# Patient Record
Sex: Female | Born: 1940 | Race: White
Health system: Southern US, Community
[De-identification: ages and names within clinical notes are randomized; demographics above are authoritative.]

---

## 2019-02-27 ENCOUNTER — Other Ambulatory Visit: Payer: Self-pay

## 2019-02-27 ENCOUNTER — Encounter: Payer: Self-pay | Admitting: Orthopaedic Surgery

## 2019-02-27 ENCOUNTER — Ambulatory Visit (INDEPENDENT_AMBULATORY_CARE_PROVIDER_SITE_OTHER): Payer: Medicare HMO | Admitting: Orthopaedic Surgery

## 2019-02-27 DIAGNOSIS — M25531 Pain in right wrist: Secondary | ICD-10-CM

## 2019-02-27 NOTE — Progress Notes (Signed)
Office Visit Note   Patient: Joan Hawkins           Date of Birth: 09/21/1940           MRN: [id number] Visit Date: 02/27/2019              Requested by: No referring provider defined for this encounter. PCP: Patient, No Pcp Per   Assessment & Plan: Visit Diagnoses:  1. Pain in right wrist     Plan: At this point I do feel an MRI arthrogram of the right wrist is warranted to rule out a TFCC tear or any other pathology on the ulnar side of her wrist that is causing the severity of her pain.  This is also warranted given the failure of her conservative treatment through multiple modalities.  All question concerns were answered and addressed.  She will call for follow-up ointment which she knows when she is having the MRI arthrogram of her right wrist.  Follow-Up Instructions:  Follow-up after right wrist MRI  Orders:  No orders of the defined types were placed in this encounter.  No orders of the defined types were placed in this encounter.     Procedures: No procedures performed   Clinical Data: No additional findings.   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  She denies any specific injuries and though she has some arthritic changes but this is the pain is been getting worse and it does shoot up into her forearm.  She has had at least 3 steroid injections in the area that have only helped temporarily.  She apparently had nerve conduction studies that only showed some mild carpal tunnel disease but no cubital tunnel disease.  She has had an ultrasound that did not show any type of mass on her wrist.  It has at this point detrimentally affecting her mobility, her quality of life and her actives of daily living.  She is tried anti-inflammatories as well as therapy and activity  modification.  HPI  Review of Systems She currently denies any headache, chest pain, shortness of breath, fever, chills, nausea, vomiting  Objective: Vital Signs: There were no vitals taken for this visit.  Physical Exam She is alert and orient x3 and in no acute distress Ortho Exam Examination of her right wrist does show significant pain to palpation over the ulnar styloid and ulnar side of her wrist.  Ulnar deviation causes pain.  The extremes of pronation supination causes severe ulnar-sided wrist pain.  She has negative Tinel sign at the carpal tunnel and a negative pillow sign at the cubital tunnel both on the right side.  She does have weak pinch and grip strength but this is equal bilaterally.  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 Specialty Comments:  No specialty comments available.  Imaging: No results found.   PMFS History: There are no active problems to display for this patient.  History reviewed. No pertinent past medical history.  History reviewed. No pertinent family history.  History reviewed. No pertinent surgical history. Social History   Occupational History   Not on file  Tobacco Use   Smoking status: Not on file  Substance and Sexual Activity   Alcohol use: Not on file   Drug use:  Not on file   Sexual activity: Not on file

## 2019-03-08 ENCOUNTER — Other Ambulatory Visit: Payer: Self-pay | Admitting: Orthopaedic Surgery

## 2019-03-08 DIAGNOSIS — M25531 Pain in right wrist: Secondary | ICD-10-CM

## 2019-03-22 ENCOUNTER — Ambulatory Visit
Admission: RE | Admit: 2019-03-22 | Discharge: 2019-03-22 | Disposition: A | Discharge status: Home / Self Care | Payer: Medicare HMO | Source: Ambulatory Visit | Attending: Orthopaedic Surgery | Admitting: Orthopaedic Surgery

## 2019-03-22 ENCOUNTER — Other Ambulatory Visit: Payer: Self-pay

## 2019-03-22 DIAGNOSIS — M25531 Pain in right wrist: Secondary | ICD-10-CM

## 2019-03-22 MED ORDER — IOPAMIDOL (ISOVUE-M 200) INJECTION 41%
4.0000 mL | Freq: Once | INTRAMUSCULAR | Status: AC
  Administered 2019-03-22: 4 mL via INTRA_ARTICULAR

## 2019-03-27 ENCOUNTER — Ambulatory Visit (INDEPENDENT_AMBULATORY_CARE_PROVIDER_SITE_OTHER): Payer: Medicare HMO | Admitting: Orthopaedic Surgery

## 2019-03-27 ENCOUNTER — Other Ambulatory Visit: Payer: Self-pay

## 2019-03-27 ENCOUNTER — Encounter: Payer: Self-pay | Admitting: Orthopaedic Surgery

## 2019-03-27 DIAGNOSIS — M25531 Pain in right wrist: Secondary | ICD-10-CM

## 2019-03-27 MED ORDER — LIDOCAINE HCL 1 % IJ SOLN
1.0000 mL | INTRAMUSCULAR | Status: AC | PRN
  Administered 2019-03-27: 1 mL

## 2019-03-27 MED ORDER — METHYLPREDNISOLONE ACETATE 40 MG/ML IJ SUSP
40.0000 mg | INTRAMUSCULAR | Status: AC | PRN
  Administered 2019-03-27: 40 mg via INTRA_ARTICULAR

## 2019-03-27 NOTE — Progress Notes (Signed)
Office Visit Note   Patient: Joan Hawkins           Date of Birth: [birth year]           MRN: [id number] Visit Date: 03/27/2019              Requested by: No referring provider defined for this encounter. PCP: Laqueta DueFurr, Sara M., MD   Assessment & Plan: Visit Diagnoses:  1. Pain in right wrist     Plan: I gave her reassurance that this is likely a chronic process.  I talked her about referring her to a hand specialist but she says right now she just like to try conservative treatment.  I recommended Voltaren gel and did recommend a steroid injection in this area of maximal tenderness.  She agreed with this as well.  I explained the risk benefits of steroid injections and provided an injection of lidocaine and Depo-Medrol in the ulnar aspect of her wrist.  This did give her relief.  All question concerns were answered and addressed.  Follow-up with me as needed.  Follow-Up Instructions: Return if symptoms worsen or fail to improve.   Orders:  Orders Placed This Encounter  Procedures   Medium Joint Inj   No orders of the defined types were placed in this encounter.     Procedures: Medium Joint Inj: R intercarpal on 03/27/2019 11:26 AM Medications: 1 mL lidocaine 1 %; 40 mg methylPREDNISolone acetate 40 MG/ML      Clinical Data: No additional findings.   Subjective: Chief Complaint  Patient presents with   Right Wrist - Follow-up  The patient is a 78 year old right-hand-dominant female who comes in again today to evaluate an MRI of her right wrist that was done with contrast.  She did have an acute on chronic right wrist swelling on the ulnar aspect of the wrist.  This is worrisome for her.  I was not able to aspirate any type of fluid from this consistent with a cyst but it seem like there is more of an arthropathy or inflammatory process going on.  We did send her for the MRI due to the worrisome nature of this mass.  She says it hurts mainly with activities.  She does use her  hands quite a bit.  She says is something she can live with but she was least concerned enough about how it is been slowly getting larger if she felt.  It was appropriate to obtain an MRI at this point to further assess the anatomy of the ulnar side of her right wrist.  HPI  Review of Systems She currently denies any headache, chest pain, shortness of breath, fever, chills, nausea, vomiting  Objective: Vital Signs: There were no vitals taken for this visit.  Physical Exam She is alert and orient x3 and in no acute distress Ortho Exam Examination of her right wrist does show fullness on the ulnar aspect of her wrist with no redness.  It is painful to palpation along the distal ulna and the carpal bones on that side of her wrist.  Her range of motion is full but it does hurt on extremes of radial and ulnar deviation as well as flexion and extension.  Her hand is well-perfused and is neurovascularly intact. Specialty Comments:  No specialty comments available.  Imaging: No results found. 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  There is soft tissue edema ulnar-sided wrist.  There is no mass or otherwise worrisome pathologic features.  PMFS History: There are no active problems to display for this patient.  History reviewed. No pertinent past medical history.  History reviewed. No pertinent family history.  History reviewed. No pertinent surgical history. Social History   Occupational History   Not on file  Tobacco Use   Smoking status: Not on file  Substance and Sexual Activity   Alcohol use: Not on file   Drug use: Not on file   Sexual activity: Not on file

## 2019-11-29 IMAGING — XA DG FLUORO GUIDE NDL PLC/BX
1 series · 1 of 1 positions shown · non-contrast
Comparison: none

CLINICAL DATA: Ulnar-sided wrist pain and swelling

[Series 1: ortho standard · 1 of 1 slices shown]
[im 1/1]
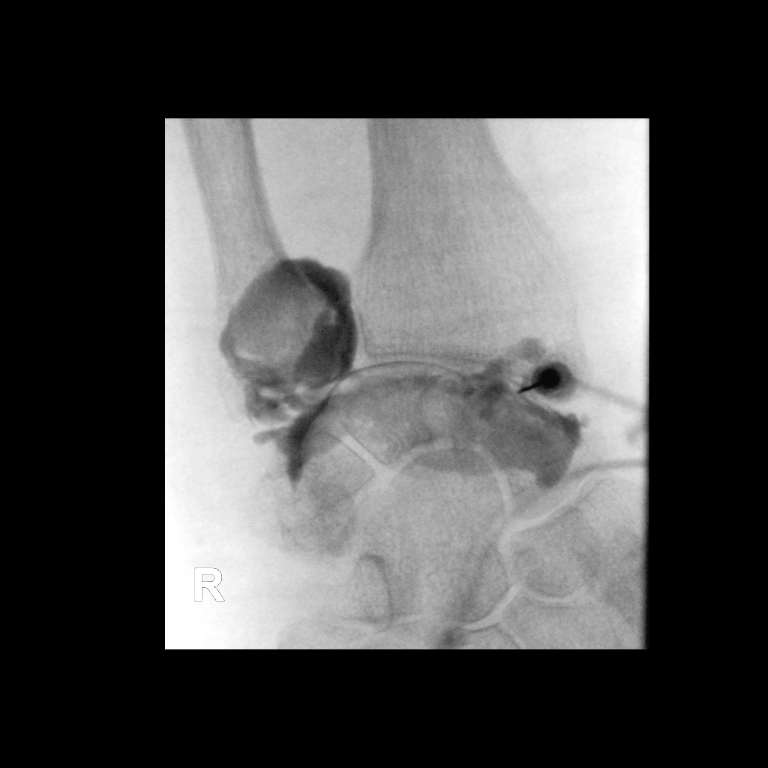

[1 of 1 positions shown; findings below may reference images not displayed]

FLUOROSCOPY TIME:  Fluoroscopy Time:  15 seconds

PROCEDURE:
RIGHT WRIST INJECTION UNDER FLUOROSCOPY

An appropriate skin entrance site was determined. The site was
marked, prepped with Betadine, draped in the usual sterile fashion,
and infiltrated locally with 1% Lidocaine. A 25 gauge skin needle
was advanced into the radiocarpal joint under intermittent
fluoroscopy. A total of 4 mL of a mixture of 0.05 ml Multihance and
10 ml of dilute Omnipaque 180 was then used to opacify the proximal
carpal joint. No immediate complication.
IMPRESSION: Technically successful right wrist injection for MRI.
